# Patient Record
Sex: Female | Born: 1965 | Hispanic: No | Marital: Married | Smoking: Never smoker
Health system: Southern US, Community
[De-identification: ages and names within clinical notes are randomized; demographics above are authoritative.]

---

## 2013-07-23 ENCOUNTER — Emergency Department (HOSPITAL_COMMUNITY)
Admission: EM | Admit: 2013-07-23 | Discharge: 2013-07-23 | Disposition: A | Discharge status: Home / Self Care | Payer: Medicaid Other | Source: Home / Self Care | Attending: Family Medicine | Admitting: Family Medicine

## 2013-07-23 ENCOUNTER — Encounter (HOSPITAL_COMMUNITY): Payer: Self-pay | Admitting: Emergency Medicine

## 2013-07-23 DIAGNOSIS — M79609 Pain in unspecified limb: Secondary | ICD-10-CM

## 2013-07-23 DIAGNOSIS — M79644 Pain in right finger(s): Secondary | ICD-10-CM

## 2013-07-23 MED ORDER — INDOMETHACIN 25 MG PO CAPS: 25.0000 mg | ORAL_CAPSULE | Freq: Two times a day (BID) | ORAL | Status: DC

## 2013-07-23 NOTE — ED Provider Notes (Signed)
CSN: [id number]     Arrival date & time 07/23/13  1428 History   First MD Initiated Contact with Patient 07/23/13 1514     Chief Complaint  Patient presents with  . Hand Pain   (Consider location/radiation/quality/duration/timing/severity/associated sxs/prior Treatment) HPI Comments: Patient reports 1-2 weeks of discomfort in her right thumb without report of injury. States she has taken 1-2 doses of unknown OTC pain medication with little relief. Denies previous episodes. She is right handed. Unemployed. Reports some mild swelling. Discomfort occurs primarily when she has to pick up objects with her right hand. Denies skin changes.  PCP: [facility]  Patient is a 48 y.o. female presenting with hand pain. The history is provided by the patient and a relative. The history is limited by a language barrier. A language interpreter was used (family member).  Hand Pain    History reviewed. No pertinent past medical history. History reviewed. No pertinent past surgical history. History reviewed. No pertinent family history. History  Substance Use Topics  . Smoking status: Not on file  . Smokeless tobacco: Not on file  . Alcohol Use: Not on file   OB History   Grav Para Term Preterm Abortions TAB SAB Ect Mult Living                 Review of Systems  All other systems reviewed and are negative.    Allergies  Review of patient's allergies indicates no known allergies.  Home Medications   Current Outpatient Rx  Name  Route  Sig  Dispense  Refill  . indomethacin (INDOCIN) 25 MG capsule   Oral   Take 1 capsule (25 mg total) by mouth 2 (two) times daily with a meal. As needed for pain   30 capsule   0    BP 131/90  Pulse 83  Temp(Src) 98.3 F (36.8 C) (Oral)  Resp 16 Physical Exam  Nursing note and vitals reviewed. Constitutional: She is oriented to person, place, and time. She appears well-developed and well-nourished. No distress.  HENT:  Head: Normocephalic  and atraumatic.  Eyes: Conjunctivae are normal.  Cardiovascular: Normal rate.   Pulmonary/Chest: Effort normal.  Musculoskeletal: Normal range of motion.       Right hand: She exhibits tenderness. She exhibits normal range of motion, no bony tenderness, normal capillary refill, no deformity and no swelling. Normal sensation noted. Normal strength noted.       Hands: Neurological: She is alert and oriented to person, place, and time.  Skin: Skin is warm and dry. No rash noted. No erythema.  No visible skin changes, erythema, rash or ecchymosis  Psychiatric: She has a normal mood and affect. Her behavior is normal.    ED Course  Procedures (including critical care time) Labs Review Labs Reviewed - No data to display Imaging Review No results found.   MDM   1. Pain of right thumb   NSAIDs as prescribed. Ice prn. Follow up PCP if no improvement.     Jess BartersJennifer Lee OnawayPresson, GeorgiaPA 07/23/13 [phone]

## 2013-07-23 NOTE — ED Notes (Signed)
Pt  Reports  Pain and  Swelling of  The  r  Thumb        -  She  denys  Any  Injury         She  Rep[orts  The  Symptoms  For  About 2  Weeks   She  Reports  A  Headache  As  Well

## 2013-07-24 NOTE — ED Provider Notes (Signed)
Medical screening examination/treatment/procedure(s) were performed by a resident physician or non-physician practitioner and as the supervising physician I was immediately available for consultation/collaboration.  Mikhayla Phillis, MD    Wilfrid Hyser S Shiryl Ruddy, MD 07/24/13 0751 

## 2013-10-07 ENCOUNTER — Encounter: Payer: Self-pay | Admitting: Family Medicine

## 2013-10-07 ENCOUNTER — Ambulatory Visit (INDEPENDENT_AMBULATORY_CARE_PROVIDER_SITE_OTHER): Payer: Medicaid Other | Admitting: Family Medicine

## 2013-10-07 VITALS — BP 124/82 | HR 82 | Temp 97.8°F | Ht 58.75 in | Wt 133.4 lb

## 2013-10-07 DIAGNOSIS — M25529 Pain in unspecified elbow: Secondary | ICD-10-CM

## 2013-10-07 DIAGNOSIS — R209 Unspecified disturbances of skin sensation: Secondary | ICD-10-CM

## 2013-10-07 DIAGNOSIS — M109 Gout, unspecified: Secondary | ICD-10-CM

## 2013-10-07 DIAGNOSIS — R208 Other disturbances of skin sensation: Secondary | ICD-10-CM | POA: Insufficient documentation

## 2013-10-07 DIAGNOSIS — H538 Other visual disturbances: Secondary | ICD-10-CM

## 2013-10-07 DIAGNOSIS — Z0000 Encounter for general adult medical examination without abnormal findings: Secondary | ICD-10-CM

## 2013-10-07 LAB — CBC WITH DIFFERENTIAL/PLATELET
Basophils Absolute: 0.1 10*3/uL (ref 0.0–0.1)
Basophils Relative: 1 % (ref 0–1)
EOS ABS: 0.3 10*3/uL (ref 0.0–0.7)
EOS PCT: 3 % (ref 0–5)
HCT: 36.6 % (ref 36.0–46.0)
HEMOGLOBIN: 12.2 g/dL (ref 12.0–15.0)
Lymphocytes Relative: 32 % (ref 12–46)
Lymphs Abs: 3.2 10*3/uL (ref 0.7–4.0)
MCH: 27.2 pg (ref 26.0–34.0)
MCHC: 33.3 g/dL (ref 30.0–36.0)
MCV: 81.7 fL (ref 78.0–100.0)
MONO ABS: 0.6 10*3/uL (ref 0.1–1.0)
MONOS PCT: 6 % (ref 3–12)
Neutro Abs: 5.9 10*3/uL (ref 1.7–7.7)
Neutrophils Relative %: 58 % (ref 43–77)
Platelets: 347 10*3/uL (ref 150–400)
RBC: 4.48 MIL/uL (ref 3.87–5.11)
RDW: 13.5 % (ref 11.5–15.5)
WBC: 10.1 10*3/uL (ref 4.0–10.5)

## 2013-10-07 LAB — POCT GLYCOSYLATED HEMOGLOBIN (HGB A1C): Hemoglobin A1C: 5.5

## 2013-10-07 MED ORDER — GABAPENTIN 100 MG PO CAPS: 100.0000 mg | ORAL_CAPSULE | Freq: Every day | ORAL | Status: AC

## 2013-10-07 MED ORDER — INDOMETHACIN 25 MG PO CAPS: 25.0000 mg | ORAL_CAPSULE | Freq: Two times a day (BID) | ORAL | Status: DC

## 2013-10-07 NOTE — Assessment & Plan Note (Addendum)
A: had questionable brain [place] many [place] Has been asymptomatic until last year [facility] gave her [facility] with relief P: given reassuring hx will hold on CT head at this time Plan for pt to go see optho again for reassessment Red flags reviewed and reasons to rtc

## 2013-10-07 NOTE — Patient Instructions (Addendum)
Natasha Hamilton it was great to meet you and your husband today!  I am sorry you are experiencing some discomfort.  For your hand pain I think we should start indomethacin twice a day. You can stop this medicine 1-2 days after your symptoms resolve. Please call if things do not improve  For your foot tingling, you can start a medicine called gabapentin 100mg  daily Please schedule an appointment at the front desk for ABI (dopplers) to evaluate blood flow in your legs  For your vision please make an appointment to see an eye doctor. If your vision does not get better, if you have nausea or vomiting or worsening headaches please call the office right away [phone]   Looking forward to seeing you soon Charlane FerrettiMelanie C Lindie Roberson, MD

## 2013-10-07 NOTE — Assessment & Plan Note (Signed)
A: questionable peripheral neuropathy vs claudication; would favor peripheral neuropathy but pt attesting to numbness and decreased sensation after walking prolonged distances. A1c currently 5.5 P: at this time favor trial of gabapentin 100qhs Pt to return to see Dr. Raymondo BandKoval this Thursday may 21st at 8:45 am for ABIs Would hold on invasive testing at this time CBC with diff and BMET pending

## 2013-10-07 NOTE — Progress Notes (Signed)
Patient ID: Natasha PeekKhundri Flicker, female   DOB: 09/22/1965, 48 y.o.   MRN: [id number] [facility] Charlane FerrettiMelanie C Lawayne Hartig, MD Phone: [phone]  Subjective:  Ms Natasha BeersSanyasi is a 48 y.o F here to establish care (also has been c/o intermittent pain in hand and bilat foot burning)  Discussions aided by use of excellent interpretor   # Hand pain -no current medications, was given indomethacin by urgent care recently with excellent relief -describes thumb pain as intermittent, recent flare has lasted the last 1-2 weeks -difficulty with movement, no inciting factors or trauma -was told a while ago that she had arthritis   # Burning feet -experiences nearly every day for the last 3 months -extremely uncomfortable esp in the summer months -bilat discomfort but left is worse than right -worse after walking for extended periods of time -has some numbness and cramping on bottom of feet at night time  # Vision changes -had a questionable cyst removed years ago? Not assc with vision problems -has blurry vision from right eye, resolved with wear glasses -no nausea or vomiting, no sudden blackness or complete loss of vision  All systems were reviewed and were negative unless otherwise noted in the HPI  Past Medical History There are no active problems to display for this patient.  Reviewed problem list.  Medications- reviewed and updated Chief complaint-noted No additions to family history Social history- patient is a never smoker  Objective: BP 124/82  Pulse 82  Temp(Src) 97.8 F (36.6 C) (Oral)  Ht 4' 10.75" (1.492 m)  Wt 133 lb 6.4 oz (60.51 kg)  BMI 27.18 kg/m2 Gen: NAD, alert, cooperative with exam HEENT: NCAT, EOMI, PERRL, Red reflex present bilaterally. Neck: FROM, supple CV: RRR, good S1/S2, no murmur, cap refill <3 Resp: CTABL, no wheezes, non-labored Abd: SNTND, BS present, no guarding or organomegaly Ext: Increased swelling and redness over MCP on right thumb,  decreased ROM with abduction and opposition; strength otherwise in tact  Neuro: Alert and oriented, decreased sensation on bottom of left foot (no ulcers or lesions noted) Skin: no rashes no lesions  Assessment/Plan: See problem based a/p

## 2013-10-07 NOTE — Assessment & Plan Note (Signed)
A: thumb sx seem most likely related to gout vs arthritis P: pt would like to try medication first -indomethacin 25mg  BID for next several days until asymptomatic -BMET -uric acid pending

## 2013-10-08 LAB — BASIC METABOLIC PANEL
BUN: 10 mg/dL (ref 6–23)
CALCIUM: 9.4 mg/dL (ref 8.4–10.5)
CO2: 26 mEq/L (ref 19–32)
Chloride: 103 mEq/L (ref 96–112)
Creat: 0.79 mg/dL (ref 0.50–1.10)
GLUCOSE: 82 mg/dL (ref 70–99)
Potassium: 4.1 mEq/L (ref 3.5–5.3)
Sodium: 138 mEq/L (ref 135–145)

## 2013-10-08 LAB — URIC ACID: Uric Acid, Serum: 4.8 mg/dL (ref 2.4–7.0)

## 2013-10-09 ENCOUNTER — Encounter: Payer: Self-pay | Admitting: Pharmacist

## 2013-10-09 ENCOUNTER — Ambulatory Visit (INDEPENDENT_AMBULATORY_CARE_PROVIDER_SITE_OTHER): Payer: Medicaid Other | Admitting: Pharmacist

## 2013-10-09 VITALS — BP 108/75 | HR 76 | Ht 58.47 in | Wt 130.8 lb

## 2013-10-09 DIAGNOSIS — R209 Unspecified disturbances of skin sensation: Secondary | ICD-10-CM

## 2013-10-09 DIAGNOSIS — R208 Other disturbances of skin sensation: Secondary | ICD-10-CM

## 2013-10-09 NOTE — Progress Notes (Signed)
S:    Patient arrives in good spirits, accompanied by her husband.   She presents to the clinic for PAD/ABI evaluation.   Interview with patient was limited by language barrier; her husband was able to translate for the most part, though still some limitation. She reports pain with walking as well as at rest.  Pain is described as tingling and burning in both feet, most particularly on the bottom of the foot. She endorses some relief with her gabapentin therapy, though cannot articulate what helps with the pain or what makes the pain worse. She also endorses use of chewing tobacco, about 2-3 times per day.   O:  Lower extremity Physical Exam overall relatively unrevealing. No cyanotic discoloration noted. Pulses were diminished, though no other abnormalities noted.   ABI overall = 1.02. Right Arm 110 mmHg    Left Arm 118 mmHg Right ankle posterior tibial 110 mmHg     dorsalis pedis 120 mmHg Left ankle posterior tibial 124 mmHg    dorsalis pedis 124 mmHg   A/P: Normal ABI and low likelihood of PAD based on ABI of 1.02 in a patient with symptoms of tingling sensation, most pronounce on bottom of feet. Results reviewed and written information provided. Continued current gabapentin regimen, and instructed patient to follow up with Dr. Michail JewelsMarsh (appointment already scheduled for early June). Total time in face-to-face counseling 20 minutes.  Patient seen with Sherie DonMichael Rollins, PharmD Candidate,  Jamse ArnKatie Felt,  PharmD Resident, Joyice FasterJonathan Binz PharmD Resident.

## 2013-10-09 NOTE — Patient Instructions (Signed)
Thank you for coming to your appointment today.   We found the same blood flow in your arms as your legs. Therefore, the pain in your foot is not likely due to poor blood flow.   Please follow-up with Dr. Michail JewelsMarsh. Please schedule an appointment with her before you leave today.   ?? ??????? ???????? ???? ????? ???? ????????  ???? ??????? ?????? ????? ????? ????? ?? ???? ?????? ?????? ??????? , ????? ?????? ?? ????? ???? ???? ???? ?????? ???? ??????? ??? ?  ???? ??- ?? ????? ?? Marsh ??? ? ????? ?? ??????? ???? ??? ?? ???????? ????????? ??????????

## 2013-10-09 NOTE — Assessment & Plan Note (Signed)
Normal ABI and low likelihood of PAD based on ABI of 1.02 in a patient with symptoms of tingling sensation, most pronounce on bottom of feet. Results reviewed and written information provided. Continued current gabapentin regimen, and instructed patient to follow up with Dr. Michail JewelsMarsh (appointment already scheduled for early June). Total time in face-to-face counseling 20 minutes.  Patient seen with Sherie DonMichael Rollins, PharmD Candidate,  Jamse ArnKatie Felt,  PharmD Resident, Joyice FasterJonathan Binz PharmD Resident.

## 2013-10-15 NOTE — Progress Notes (Signed)
Patient ID: Natasha Hamilton, female   DOB: [birth year], 48 y.o.   MRN: [id number] Reviewed: Agree with Dr. Macky Lower management and documentation.

## 2013-10-21 ENCOUNTER — Ambulatory Visit (INDEPENDENT_AMBULATORY_CARE_PROVIDER_SITE_OTHER): Payer: Medicaid Other | Admitting: Family Medicine

## 2013-10-21 ENCOUNTER — Encounter: Payer: Self-pay | Admitting: Family Medicine

## 2013-10-21 VITALS — BP 125/70 | HR 68 | Temp 97.8°F | Ht 58.75 in | Wt 132.5 lb

## 2013-10-21 DIAGNOSIS — M109 Gout, unspecified: Secondary | ICD-10-CM

## 2013-10-21 DIAGNOSIS — R208 Other disturbances of skin sensation: Secondary | ICD-10-CM

## 2013-10-21 DIAGNOSIS — R209 Unspecified disturbances of skin sensation: Secondary | ICD-10-CM

## 2013-10-21 MED ORDER — INDOMETHACIN 25 MG PO CAPS: 25.0000 mg | ORAL_CAPSULE | Freq: Two times a day (BID) | ORAL | Status: DC

## 2013-10-21 NOTE — Assessment & Plan Note (Signed)
A: still ongoing discomfort; joint appears red and swollen however significantly improved from her last visit; had nml uric acid at last visit, less suggestive of gout flare P: cont Indomethacin 25mg  BID for another 2 weeks  -pt to call if sx not improved at that time -pt deferring xray at this time

## 2013-10-21 NOTE — Progress Notes (Signed)
Patient ID: Natasha Hamilton, female   DOB: Mar 08, 1966, 48 y.o.   MRN: [id number]   [facility] [name], MD Phone: [phone]  Subjective:  Natasha Hamilton is a 48 y.o F who presents for close f/up  # follow up on thumb pain -has improved taking indomethacin daily (has taken for approx the last 2 weeks) and is almost resolved completely -still some numbness at the tip of her thumb  -worse when doing daily activities   #s/p ABI testing -burning sensation in feet drastically improved with gabapentin -takes nightly without any difficutly, no side effects from the medicines  #HCM -cannot recall when the last time she had a pap smear was   All systems were reviewed and were negative unless otherwise noted in the HPI  Past Medical History Patient Active Problem List   Diagnosis Date Noted  . Gout flare 10/07/2013  . Burning sensation of the foot 10/07/2013  . Blurry vision, right eye 10/07/2013   Reviewed problem list.  Medications- reviewed and updated Chief complaint-noted No additions to family history Social history- patient is a never smoker  Objective: There were no vitals taken for this visit. Gen: NAD, alert, cooperative with exam Ext: No edema, warm, normal tone, moves UE/LE spontaneously; Increased swelling and redness over MCP on right thumb, decreased ROM with abduction and opposition; strength otherwise in tact  Neuro: Alert and oriented, No gross deficits Skin: no rashes no lesions  Assessment/Plan: See problem based a/p

## 2013-10-21 NOTE — Patient Instructions (Signed)
Ms Kaleyah it was great to see you today!  I am pleased to hear that things are going well for you. Continue to take the indomethacin medication.Please let me know if things do not get better I think likely you have gout in your finger which is getting better  If you experiencing worsening of pain please call right away and we can schedule an xray  Looking forward to seeing you soon Please call sometime this year to schedule an appointment for a women's health visit  [name], MD

## 2013-10-21 NOTE — Assessment & Plan Note (Signed)
A: pt with sig improvement after gabapentin Did have a nml A1C making diabetic periph neuropathy less likely However may have from other source Did have nml ABI testing with Dr. Raymondo Band, less suggestive of claudication P: cont gabapentin 100 nightly

## 2013-11-03 ENCOUNTER — Encounter: Payer: Self-pay | Admitting: Family Medicine

## 2013-11-03 ENCOUNTER — Other Ambulatory Visit (HOSPITAL_COMMUNITY)
Admission: RE | Admit: 2013-11-03 | Discharge: 2013-11-03 | Disposition: A | Discharge status: Home / Self Care | Payer: Medicaid Other | Source: Ambulatory Visit | Attending: Family Medicine | Admitting: Family Medicine

## 2013-11-03 ENCOUNTER — Ambulatory Visit (INDEPENDENT_AMBULATORY_CARE_PROVIDER_SITE_OTHER): Payer: Medicaid Other | Admitting: Family Medicine

## 2013-11-03 VITALS — BP 115/79 | HR 68 | Temp 97.9°F | Resp 18 | Ht 58.27 in | Wt 133.0 lb

## 2013-11-03 DIAGNOSIS — Z124 Encounter for screening for malignant neoplasm of cervix: Secondary | ICD-10-CM

## 2013-11-03 DIAGNOSIS — L293 Anogenital pruritus, unspecified: Secondary | ICD-10-CM

## 2013-11-03 DIAGNOSIS — Z01419 Encounter for gynecological examination (general) (routine) without abnormal findings: Secondary | ICD-10-CM | POA: Insufficient documentation

## 2013-11-03 DIAGNOSIS — N889 Noninflammatory disorder of cervix uteri, unspecified: Secondary | ICD-10-CM

## 2013-11-03 DIAGNOSIS — N898 Other specified noninflammatory disorders of vagina: Secondary | ICD-10-CM

## 2013-11-03 DIAGNOSIS — Z1151 Encounter for screening for human papillomavirus (HPV): Secondary | ICD-10-CM | POA: Insufficient documentation

## 2013-11-03 MED ORDER — TRIAMCINOLONE ACETONIDE 0.1 % EX CREA: 1.0000 "application " | TOPICAL_CREAM | Freq: Two times a day (BID) | CUTANEOUS | Status: AC

## 2013-11-03 NOTE — Assessment & Plan Note (Signed)
Chronic; Bilat groin region, with assc hx of bruising and excoriations Had nml plt count last visit No lesions or signs of infection at this time Also c/o sx on back (does have area of hyperpigmentation in "itchy" region) Does not appear to be zoster  Possible irritation from sweat Plan for kenalog cream prn Could consider testing for bili but cholestasis highly unlike given does not involve palms or soles

## 2013-11-03 NOTE — Assessment & Plan Note (Signed)
Cervical lesion noted at 12 oclock, nml vag discharge Possible laceration or trauma from birth  Vs HPV or pre-malignant lesions Unclear last time she had pap and cervical cancer screening No red flags at this time Sent for HPV and PAP  Could consider colpo if high risk

## 2013-11-03 NOTE — Patient Instructions (Signed)
Ms Donell BeersSanyasi it was great to see you today!  I am pleased to hear that things are going well for you. For your itching  Make sure that you are keeping those areas dry, and free of sweat, you can try baby power if that helps You can also try some steroid cream, but do not apply to your face or inside your body   I will call you with the results of your PAP smear It is normal to have some spotting afterwards  Looking forward to seeing you soon Charlane FerrettiMelanie C Jazae Gandolfi, MD

## 2013-11-03 NOTE — Progress Notes (Signed)
Patient ID: Natasha Hamilton, female   DOB: 11/17/1965, 48 y.o.   MRN: [id number]   [facility] Natasha FerrettiMelanie C Dandrea Medders, Natasha Hamilton Phone: [phone]  Subjective:  Natasha Hamilton is a 48 y.o F who presents for PAP  # HCM -needs cervical cancer screening, no fmhx  -no weight loss, sweats, or other sx of malignancy  #Itching -has bilat groin itching, noted for many years but has been nervous to bring it up to female provider -also has some back itching, tries baby powder with some relief -occasionally will notice bruising in these spots   All systems were reviewed and were negative unless otherwise noted in the HPI  Past Medical History Patient Active Problem List   Diagnosis Date Noted  . Gout flare 10/07/2013  . Burning sensation of the foot 10/07/2013  . Blurry vision, right eye 10/07/2013   Reviewed problem list.  Medications- reviewed and updated Chief complaint-noted No additions to family history Social history- patient is a never smoker  Objective: BP 115/79  Pulse 68  Temp(Src) 97.9 F (36.6 C) (Oral)  Resp 18  Ht 4' 10.27" (1.48 m)  Wt 133 lb (60.328 kg)  BMI 27.54 kg/m2  SpO2 99% Gen: NAD, alert, cooperative with exam HEENT: NCAT, EOMI Neck: FROM, supple Abd: SNTND, BS present, no guarding or organomegaly GU: no external lesions noted or signs of atrophy, pubic hair present, vaginal canal appears nml, cervix with erthematous region at approx 12oclock, normal vag discharge noted Ext: No edema, warm, normal tone, moves UE/LE spontaneously Neuro: Alert and oriented, No gross deficits Skin: no rashes no lesions bilat inguinal creases examined and no spots, bruising or lesions noted   Assessment/Plan: See problem based a/p

## 2013-11-05 LAB — CYTOLOGY - PAP

## 2013-11-07 ENCOUNTER — Encounter: Payer: Self-pay | Admitting: Family Medicine

## 2014-01-28 ENCOUNTER — Ambulatory Visit (INDEPENDENT_AMBULATORY_CARE_PROVIDER_SITE_OTHER): Payer: Medicaid Other | Admitting: Family Medicine

## 2014-01-28 ENCOUNTER — Ambulatory Visit (HOSPITAL_COMMUNITY)
Admission: RE | Admit: 2014-01-28 | Discharge: 2014-01-28 | Disposition: A | Discharge status: Home / Self Care | Payer: Medicaid Other | Source: Ambulatory Visit | Attending: Family Medicine | Admitting: Family Medicine

## 2014-01-28 ENCOUNTER — Encounter: Payer: Self-pay | Admitting: Family Medicine

## 2014-01-28 VITALS — BP 115/78 | HR 76 | Temp 98.1°F | Wt 129.0 lb

## 2014-01-28 DIAGNOSIS — M19041 Primary osteoarthritis, right hand: Secondary | ICD-10-CM

## 2014-01-28 DIAGNOSIS — M19049 Primary osteoarthritis, unspecified hand: Secondary | ICD-10-CM

## 2014-01-28 DIAGNOSIS — H5203 Hypermetropia, bilateral: Secondary | ICD-10-CM

## 2014-01-28 DIAGNOSIS — H5200 Hypermetropia, unspecified eye: Secondary | ICD-10-CM

## 2014-01-28 DIAGNOSIS — M109 Gout, unspecified: Secondary | ICD-10-CM

## 2014-01-28 MED ORDER — INDOMETHACIN 25 MG PO CAPS: 25.0000 mg | ORAL_CAPSULE | Freq: Two times a day (BID) | ORAL | Status: AC

## 2014-01-28 NOTE — Assessment & Plan Note (Signed)
A: pain has returned again after stopping indomethacin Potential pseudogout vs arthritis (no other joints affected) May also have component of tendonopathy but difficult to assess at this time 2/2 poor mobility P: plan for xray to look for intraarticular problem Cont 2 weeks of indomethacin for now Potential for ANA, RF testing? Could also consider referral

## 2014-01-28 NOTE — Patient Instructions (Signed)
Natasha Hamilton, It was good to see you again today.  Lets try to get an Xray of that finger again to look for problems with the joint  We will also start the anti-inflammatory medication again to help with pain  I will call you to let you know the results of the tests  Feel better soon [name], MD

## 2014-01-28 NOTE — Progress Notes (Signed)
Patient ID: Natasha Hamilton, female   DOB: 1965-12-16, 48 y.o.   MRN: [id number]   [facility] [name], MD Phone: [phone]  Subjective:  Natasha Hamilton is a 48 y.o F who presents for f/up on hand discomfort   # thumb pain -took pain medicine which somewhat improved sx -but then pain came back after stopping -had a neg w/up to date -no prior traumatic incidents preceding  -no other affected joints   #Itching  -not currently bothering her  -has been trying to stay dry during the summer months as previously instructed   All relevant systems were reviewed and were negative unless otherwise noted in the HPI  Past Medical History Reviewed problem list.  Medications- reviewed and updated Current Outpatient Prescriptions  Medication Sig Dispense Refill  . gabapentin (NEURONTIN) 100 MG capsule Take 1 capsule (100 mg total) by mouth at bedtime.  90 capsule  0  . indomethacin (INDOCIN) 25 MG capsule Take 1 capsule (25 mg total) by mouth 2 (two) times daily with a meal. As needed for pain  30 capsule  0  . triamcinolone cream (KENALOG) 0.1 % Apply 1 application topically 2 (two) times daily.  30 g  0   No current facility-administered medications for this visit.   Chief complaint-noted No additions to family history Social history- patient is a never smoker  Objective: BP 115/78  Pulse 76  Temp(Src) 98.1 F (36.7 C) (Oral)  Wt 129 lb (58.514 kg) Gen: NAD, alert, cooperative with exam Ext: No edema, warm, normal tone, moves UE/LE spontaneously, right thumb with pain with opposition and flexion extension; palpable discomfort of MCP PCP Neuro: Alert and oriented, No gross deficits Skin: no rashes no lesions  Assessment/Plan: See problem based a/p

## 2014-01-29 ENCOUNTER — Encounter: Payer: Self-pay | Admitting: Family Medicine

## 2015-08-18 IMAGING — CR DG FINGER THUMB 2+V*R*
3 series · 3 of 3 positions shown · non-contrast
Comparison: None.

CLINICAL DATA: Pain in the MCP joint.

EXAM:
RIGHT THUMB 2+V

[x finger pa right]
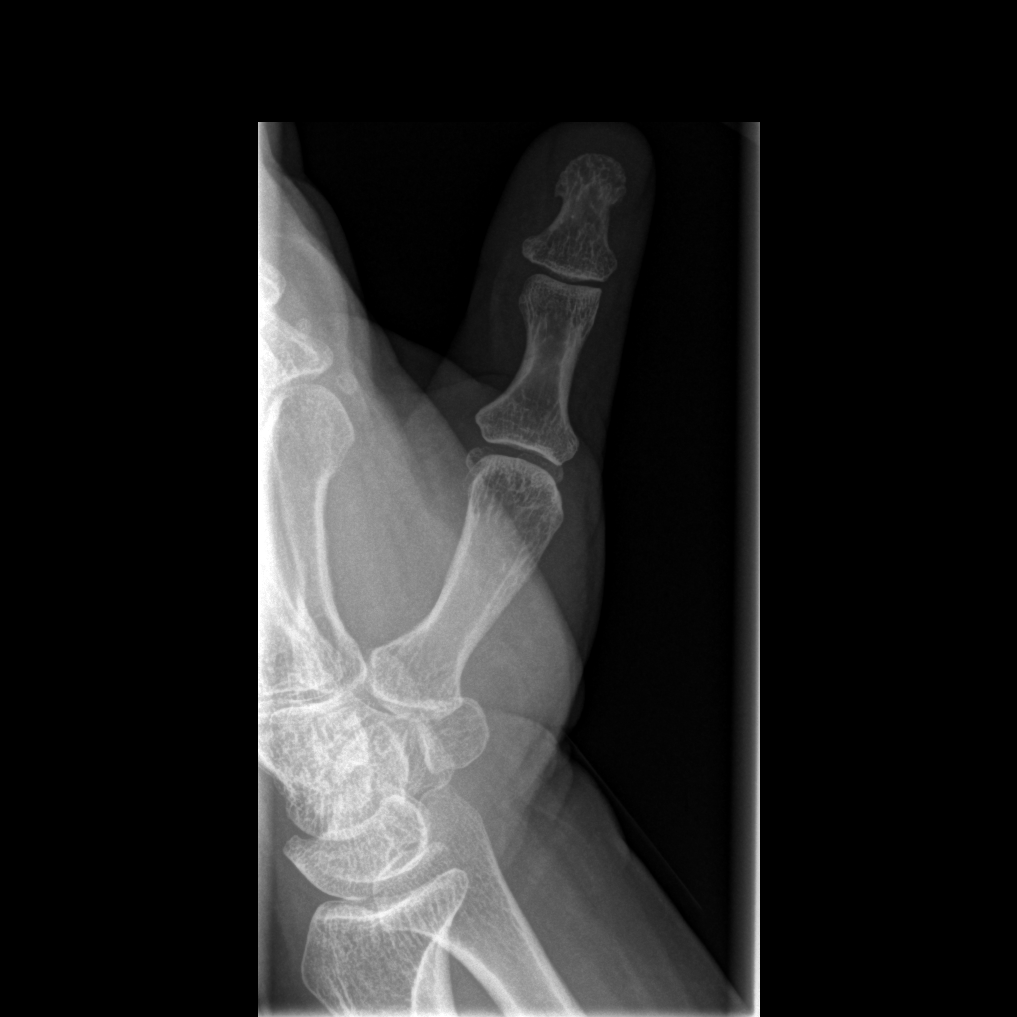

[x finger obl. right]
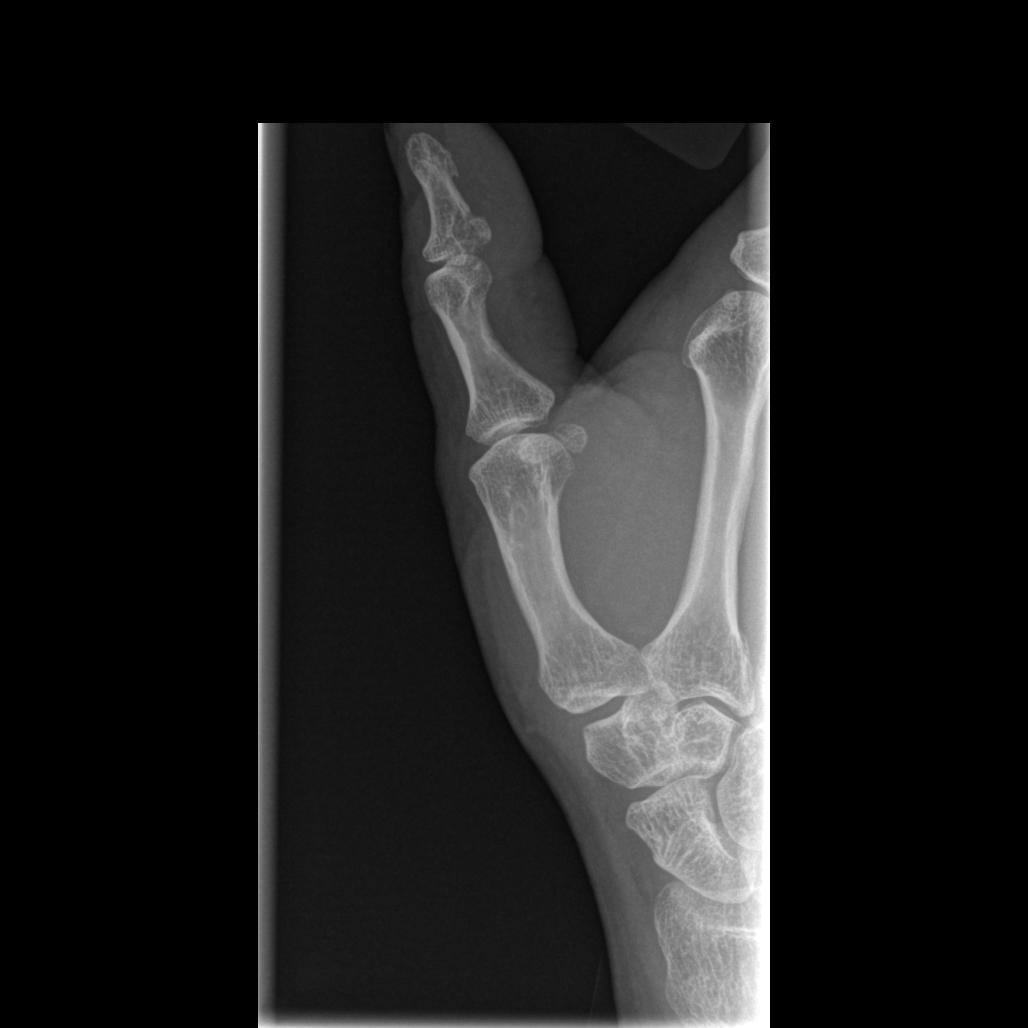

[x finger lateral right]
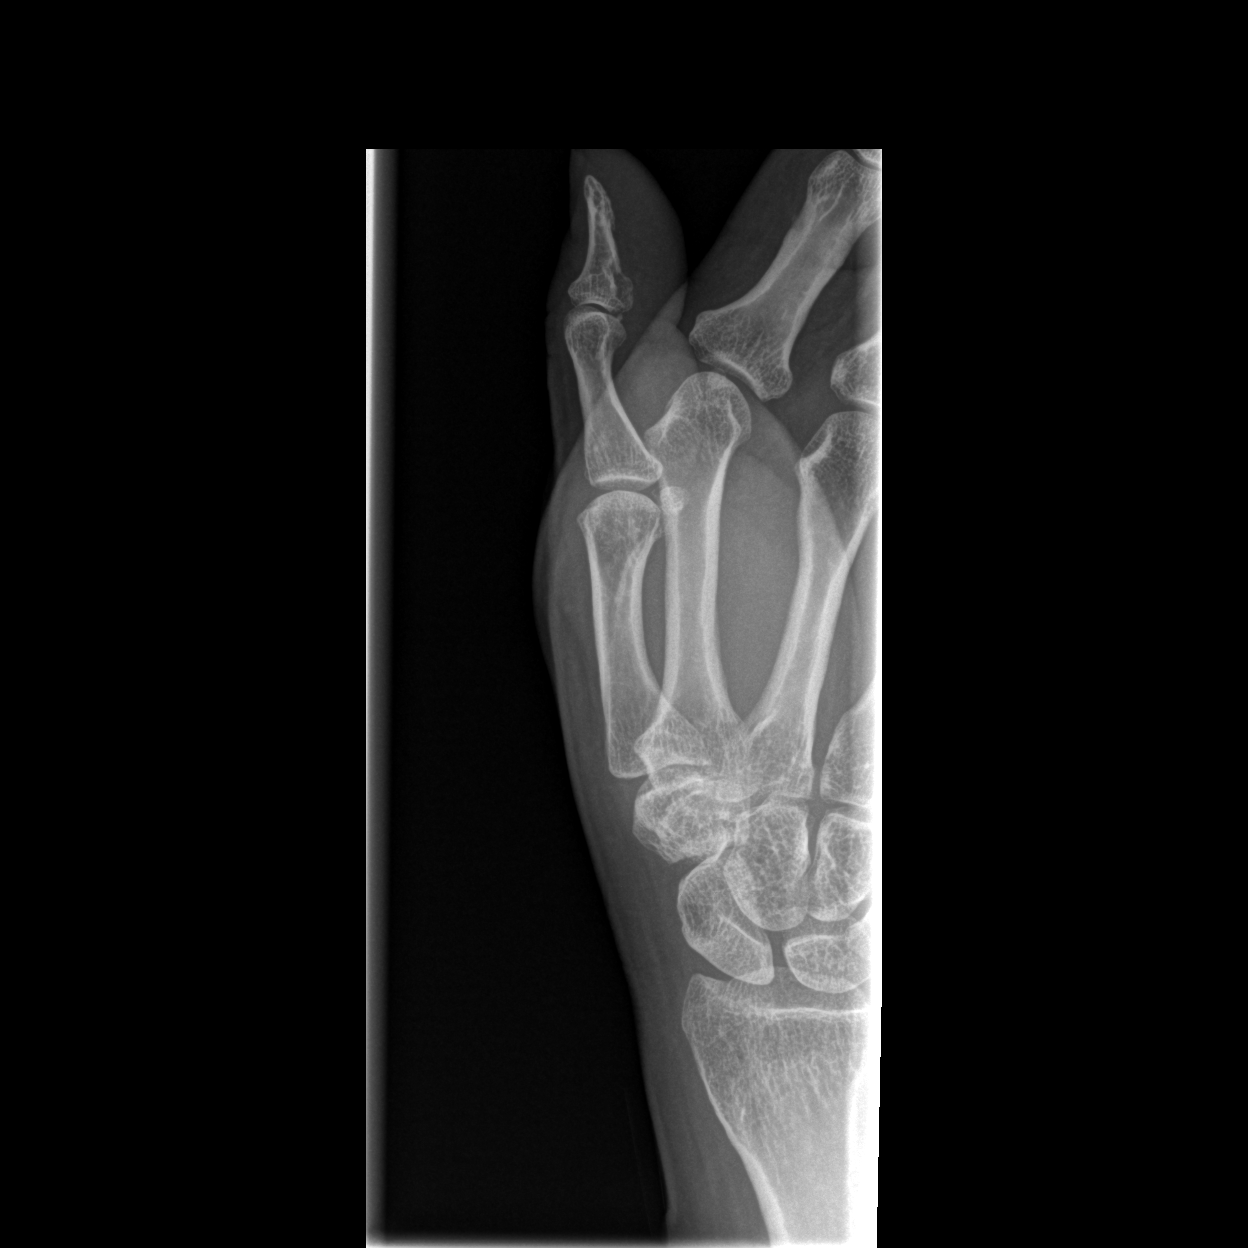

[3 of 3 positions shown; findings below may reference images not displayed]

FINDINGS: There is no evidence of fracture or dislocation. There is no
evidence of arthropathy or other focal bone abnormality. Soft
tissues are unremarkable
IMPRESSION: Negative.
